# Patient Record
Sex: Female | Born: 1985
Health system: Southern US, Community
[De-identification: ages and names within clinical notes are randomized; demographics above are authoritative.]

## PROBLEM LIST (undated history)

## (undated) DIAGNOSIS — T7840XA Allergy, unspecified, initial encounter: Secondary | ICD-10-CM

## (undated) HISTORY — DX: Allergy, unspecified, initial encounter: T78.40XA

---
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
## 2005-07-02 ENCOUNTER — Emergency Department (HOSPITAL_COMMUNITY): Admission: EM | Admit: 2005-07-02 | Discharge: 2005-07-02 | Payer: Self-pay | Admitting: Emergency Medicine

## 2005-09-15 ENCOUNTER — Emergency Department: Payer: Self-pay | Admitting: Emergency Medicine

## 2011-07-13 ENCOUNTER — Ambulatory Visit (INDEPENDENT_AMBULATORY_CARE_PROVIDER_SITE_OTHER): Payer: Managed Care, Other (non HMO) | Admitting: Family Medicine

## 2011-07-13 VITALS — BP 125/76 | HR 77 | Temp 98.4°F | Resp 16 | Ht 66.0 in | Wt 199.0 lb

## 2011-07-13 DIAGNOSIS — N390 Urinary tract infection, site not specified: Secondary | ICD-10-CM

## 2011-07-13 DIAGNOSIS — IMO0001 Reserved for inherently not codable concepts without codable children: Secondary | ICD-10-CM

## 2011-07-13 DIAGNOSIS — Z309 Encounter for contraceptive management, unspecified: Secondary | ICD-10-CM

## 2011-07-13 DIAGNOSIS — H109 Unspecified conjunctivitis: Secondary | ICD-10-CM

## 2011-07-13 LAB — POCT URINALYSIS DIPSTICK
Bilirubin, UA: NEGATIVE
Glucose, UA: NEGATIVE
Ketones, UA: NEGATIVE
Nitrite, UA: NEGATIVE

## 2011-07-13 LAB — POCT UA - MICROSCOPIC ONLY
Bacteria, U Microscopic: NEGATIVE
Casts, Ur, LPF, POC: NEGATIVE

## 2011-07-13 LAB — POCT URINE PREGNANCY: Preg Test, Ur: NEGATIVE

## 2011-07-13 MED ORDER — ETONOGESTREL-ETHINYL ESTRADIOL 0.12-0.015 MG/24HR VA RING: VAGINAL_RING | VAGINAL | Status: DC

## 2011-07-13 MED ORDER — POLYMYXIN B-TRIMETHOPRIM 10000-0.1 UNIT/ML-% OP SOLN: 1.0000 [drp] | OPHTHALMIC | Status: AC

## 2011-07-13 MED ORDER — NITROFURANTOIN MONOHYD MACRO 100 MG PO CAPS: 100.0000 mg | ORAL_CAPSULE | Freq: Two times a day (BID) | ORAL | Status: AC

## 2011-07-13 NOTE — Progress Notes (Signed)
Patient Name: Victoria Sanchez Date of Birth: 10/05/1985 Medical Record Number: [id number] Gender: female Date of Encounter: 07/13/2011  History of Present Illness:  Victoria Sanchez is a 26 y.o. very pleasant female patient who presents with the following:  Here today with possible UTI- frequent urination and pain with urination.  No hematuria. This morning she noted that she had a little vaginal spotting (blood) but did not note blood in her urine.  No fever, chills or back pain, no vomiting.  Symptoms started over the last 48 hours.    She is otherwise generally healthy This seems like a UTI to her LMP was 06/28/11  No history of major health issues in the past No vaginal symptoms that she has noticed  No contraception currently- wishes to restart nuvaring.  She does have a current partner- they have been together for some time but their relationship has been on and off and recently restarted.  She already has an appt for a pap on 4/26, last had STD testing about 6 months ago.    No tobacco, no history of DVT or PE, no migraine history.   There is no problem list on file for this patient.  No past medical history on file. No past surgical history on file. History  Substance Use Topics  . Smoking status: Current Some Day Smoker  . Smokeless tobacco: Not on file  . Alcohol Use: Not on file   No family history on file. Allergies  Allergen Reactions  . Penicillins     Medication list has been reviewed and updated.  Review of Systems: As per HPI- otherwise negative.   Physical Examination: Filed Vitals:   07/13/11 1317  BP: 125/76  Pulse: 77  Temp: 98.4 F (36.9 C)  TempSrc: Oral  Resp: 16  Height: 5\' 6"  (1.676 m)  Weight: 199 lb (90.266 kg)    Body mass index is 32.12 kg/(m^2).  GEN: WDWN, NAD, Non-toxic, A & O x 3 HEENT: Atraumatic, Normocephalic. Neck supple. No masses, No LAD.  Slight injection of right conjunctiva, left eye normal,  PEERL, EOMI.  TM and  oropharynx wnl Ears and Nose: No external deformity. CV: RRR, No M/G/R. No JVD. No thrill. No extra heart sounds. PULM: CTA B, no wheezes, crackles, rhonchi. No retractions. No resp. distress. No accessory muscle use. ABD: S, NT, ND, +BS. No rebound. No HSM. EXTR: No c/c/e NEURO Normal gait.  PSYCH: Normally interactive. Conversant. Not depressed or anxious appearing.  Calm demeanor.   Results for orders placed in visit on 07/13/11  POCT URINALYSIS DIPSTICK      Component Value Range   Color, UA yellow     Clarity, UA cloudy     Glucose, UA neg     Bilirubin, UA neg     Ketones, UA neg     Spec Grav, UA 1.010     Blood, UA large     pH, UA 6.0     Protein, UA 30     Urobilinogen, UA 0.2     Nitrite, UA neg     Leukocytes, UA moderate (2+)    POCT UA - MICROSCOPIC ONLY      Component Value Range   WBC, Ur, HPF, POC 8-13     RBC, urine, microscopic 14-18     Bacteria, U Microscopic neg     Mucus, UA small     Epithelial cells, urine per micros 0-1     Crystals, Ur, HPF, POC neg  Casts, Ur, LPF, POC neg     Yeast, UA neg    POCT URINE PREGNANCY      Component Value Range   Preg Test, Ur Negative      Assessment and Plan: 1. UTI (urinary tract infection)  POCT Urinalysis Dipstick, POCT UA - Microscopic Only, Urine culture, POCT urine pregnancy, nitrofurantoin, macrocrystal-monohydrate, (MACROBID) 100 MG capsule  2. Contraception  etonogestrel-ethinyl estradiol (NUVARING) 0.12-0.015 MG/24HR vaginal ring  3. Conjunctivitis  trimethoprim-polymyxin b (POLYTRIM) ophthalmic solution   Patient wishes to start nuva- ring for birth control.  She denies any history of DVT/PE, migraine with aura, cancer, blood clotting disorder or tobacco use.  Instructed to start after her next menstrual period as her cycle is already halfway though.  Instructed as to importance of taking medication as directed, and encouraged to use condoms as well if at risk for STIs.   Will treat for presumed  UTI with macrobid, but let me know if not better in a couple of days.  Sooner if worse.   Also treat for mild conjunctivitis with polytrim   Will follow-up further with ucx result

## 2011-07-16 LAB — URINE CULTURE: Colony Count: 30000

## 2014-01-08 ENCOUNTER — Telehealth: Payer: Self-pay

## 2014-01-08 NOTE — Telephone Encounter (Signed)
Pt wants a call back about a Nuva Ring questions.  [phone]

## 2014-01-09 NOTE — Telephone Encounter (Signed)
Lm for rtn call 

## 2014-01-17 NOTE — Telephone Encounter (Signed)
Pt went to [facility] to be seen and they answered her questions.

## 2014-10-19 ENCOUNTER — Emergency Department (HOSPITAL_BASED_OUTPATIENT_CLINIC_OR_DEPARTMENT_OTHER)
Admission: EM | Admit: 2014-10-19 | Discharge: 2014-10-19 | Disposition: A | Discharge status: Home / Self Care | Payer: BLUE CROSS/BLUE SHIELD | Attending: Emergency Medicine | Admitting: Emergency Medicine

## 2014-10-19 ENCOUNTER — Encounter (HOSPITAL_BASED_OUTPATIENT_CLINIC_OR_DEPARTMENT_OTHER): Payer: Self-pay | Admitting: *Deleted

## 2014-10-19 ENCOUNTER — Emergency Department (HOSPITAL_BASED_OUTPATIENT_CLINIC_OR_DEPARTMENT_OTHER): Payer: BLUE CROSS/BLUE SHIELD

## 2014-10-19 DIAGNOSIS — Z720 Tobacco use: Secondary | ICD-10-CM | POA: Insufficient documentation

## 2014-10-19 DIAGNOSIS — B349 Viral infection, unspecified: Secondary | ICD-10-CM | POA: Insufficient documentation

## 2014-10-19 DIAGNOSIS — Z880 Allergy status to penicillin: Secondary | ICD-10-CM | POA: Diagnosis not present

## 2014-10-19 DIAGNOSIS — R05 Cough: Secondary | ICD-10-CM | POA: Diagnosis present

## 2014-10-19 LAB — RAPID STREP SCREEN (MED CTR MEBANE ONLY): Streptococcus, Group A Screen (Direct): NEGATIVE

## 2014-10-19 NOTE — ED Notes (Signed)
Pt denies any voice/ speech changes

## 2014-10-19 NOTE — ED Notes (Signed)
MD at bedside. 

## 2014-10-19 NOTE — ED Notes (Signed)
Patient transported to X-ray 

## 2014-10-19 NOTE — ED Notes (Signed)
Presents with "lump" type feeling in throat, has some difficulty swallowing food, denies any vomiting

## 2014-10-19 NOTE — Discharge Instructions (Signed)
Make sure that you drink at least six 8 ounce glasses of water or Gatorade daily. Call the [facility] and wellness Center to get a primary care physician. Make an appointment to be seen by your new doctor if not continuing to improve in a week. Ask your new doctor to help you to stop smoking.

## 2014-10-19 NOTE — ED Provider Notes (Signed)
CSN: [id number]     Arrival date & time 10/19/14  0818 History   First MD Initiated Contact with Patient 10/19/14 [phone]     Chief Complaint  Patient presents with  . Mass     (Consider location/radiation/quality/duration/timing/severity/associated sxs/prior Treatment) HPIpatient complains of a vague feeling of fullness in her right throat which started 8 days ago which is steadily improving without treatment. Discomfort is worse with swallowing.not improved by anything.  Other associated symptoms include mild cough, chills, denies shortness of breath.all symptoms are improving with time. She no other associated symptoms  No past medical history on file.past medical history negative No past surgical history on file. No family history on file. History  Substance Use Topics  . Smoking status: Current Some Day Smoker  . Smokeless tobacco: Not on file  . Alcohol Use: Not on file  moderate alcohol denies illicit drug use OB History    No data available     Review of Systems  Constitutional: Negative.   HENT: Positive for congestion and sore throat.   Respiratory: Positive for cough.   Cardiovascular: Negative.   Gastrointestinal: Negative.   Musculoskeletal: Negative.   Skin: Negative.   Neurological: Negative.   Psychiatric/Behavioral: Negative.   All other systems reviewed and are negative.     Allergies  Penicillins  Home Medications   Prior to Admission medications   Medication Sig Start Date End Date Taking? Authorizing Provider  etonogestrel-ethinyl estradiol (NUVARING) 0.12-0.015 MG/24HR vaginal ring Insert vaginally and leave in place for 3 consecutive weeks, then remove for 1 week. 07/13/11 07/12/12  Gwenlyn Found Copland, MD   BP 118/80 mmHg  Pulse 86  Temp(Src) 98.3 F (36.8 C) (Oral)  Resp 18  Ht 5\' 5"  (1.651 m)  Wt 185 lb (83.915 kg)  BMI 30.79 kg/m2  SpO2 100% Physical Exam  Constitutional: She appears well-developed and well-nourished. No distress.  HENT:   Head: Normocephalic and atraumatic.  Right Ear: External ear normal.  Left Ear: External ear normal.  Nose: Nose normal.  Mouth/Throat: Oropharyngeal exudate present.  Bilateral tympanic membranes normal. Oral pharynx mildly reddened. No exudate. Uvula midline. Tonsils appear normal  Eyes: Conjunctivae are normal. Pupils are equal, round, and reactive to light.  Neck: Neck supple. No tracheal deviation present. No thyromegaly present.  No masses  Cardiovascular: Normal rate and regular rhythm.   No murmur heard. Pulmonary/Chest: Effort normal and breath sounds normal.  Abdominal: Soft. Bowel sounds are normal. She exhibits no distension. There is no tenderness.  Musculoskeletal: Normal range of motion. She exhibits no edema or tenderness.  Neurological: She is alert. Coordination normal.  Skin: Skin is warm and dry. No rash noted.  Psychiatric: She has a normal mood and affect.  Nursing note and vitals reviewed.   ED Course  Procedures (including critical care time) Labs Review Labs Reviewed - No data to display  Imaging Review No results found.   EKG Interpretation None      X-ray viewed by me Counseled patient for 5 minutes on smoking cessation. 950 a.m. Patient resting comfortably. No distress. Results for orders placed or performed during the hospital encounter of 10/19/14  Rapid strep screen  Result Value Ref Range   Streptococcus, Group A Screen (Direct) NEGATIVE NEGATIVE   Dg Neck Soft Tissue  10/19/2014   CLINICAL DATA:  Difficulty swallowing, sensation of something stuck in throat.  EXAM: NECK SOFT TISSUES - 1+ VIEW  COMPARISON:  None.  FINDINGS: There is no evidence of retropharyngeal soft  tissue swelling or epiglottic enlargement. The cervical airway is unremarkable and no radio-opaque foreign body identified.  IMPRESSION: No acute abnormality noted.   Electronically Signed   By: Alcide Clever M.D.   On: 10/19/2014 09:11    MDM  Strongly suspect viral illness.  Patient well-appearing, chills, mild oropharyngeal erythema.Symptoms steadily improving with time. Encourage oral hydration. Referral [facility] wellness [facility] Plan home observation. Diagnosis #1. Viral illness #2 tobacco abuse Final diagnoses:  None        Doug Sou, MD 10/19/14 [phone]

## 2014-10-19 NOTE — ED Notes (Signed)
Pt denies sore throat, denies any SOB or vomiting, tolerating food well

## 2014-10-21 LAB — CULTURE, GROUP A STREP: Strep A Culture: NEGATIVE

## 2014-12-06 ENCOUNTER — Ambulatory Visit (INDEPENDENT_AMBULATORY_CARE_PROVIDER_SITE_OTHER): Payer: BLUE CROSS/BLUE SHIELD | Admitting: Family Medicine

## 2014-12-06 ENCOUNTER — Other Ambulatory Visit: Payer: Self-pay | Admitting: Family Medicine

## 2014-12-06 VITALS — BP 122/74 | HR 66 | Temp 98.1°F | Resp 18 | Ht 66.0 in | Wt 178.0 lb

## 2014-12-06 DIAGNOSIS — J029 Acute pharyngitis, unspecified: Secondary | ICD-10-CM

## 2014-12-06 DIAGNOSIS — R634 Abnormal weight loss: Secondary | ICD-10-CM | POA: Diagnosis not present

## 2014-12-06 NOTE — Progress Notes (Signed)
[facility] [address] [place] [address] [phone]  Date:  12/06/2014   Name:  Victoria Sanchez   DOB:  10/16/1985   MRN:  [id number]  PCP:  No PCP Per Patient    Chief Complaint: Sore Throat; Headache; and Fatigue   History of Present Illness:  Victoria Sanchez is a 29 y.o. very pleasant female patient who presents with the following:  Generally healthy young woman here today with complaint of sore throat. She has been ill for about one month, was seen at the ER on 7/23 and had an "x-ray of my throat which looked ok", and had a negative strep test.    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   She was only eating soup for the first couple of weeks- think this may be why she has lost some weight.   She has not thought that she had heartburn- she did try prilosec for one day but did not notice a difference   She did have some diarrhea at first, but this is now better She has not noted a fever, but did have some chills at first- this is now resolved  LMP 3 weeks ago  Wt Readings from Last 3 Encounters:  12/06/14 178 lb (80.74 kg)  10/19/14 185 lb (83.915 kg)  07/13/11 199 lb (90.266 kg)    There are no active problems to display for this patient.   History reviewed. No pertinent past medical history.  History reviewed. No pertinent past surgical history.  Social History  Substance Use Topics  . Smoking status: Never Smoker   . Smokeless tobacco: None  . Alcohol Use: 0.0 oz/week    0 Standard drinks or  equivalent per week     Comment: social    History reviewed. No pertinent family history.  Allergies  Allergen Reactions  . Penicillins     Medication list has been reviewed and updated.  Current Outpatient Prescriptions on File Prior to Visit  Medication Sig Dispense Refill  . etonogestrel-ethinyl estradiol (NUVARING) 0.12-0.015 MG/24HR vaginal ring Insert vaginally and leave in place for 3 consecutive weeks, then remove for 1 week. 3 each 3   No current facility-administered medications on file prior to visit.    Review of Systems:  As per HPI- otherwise negative.   Physical Examination: Filed Vitals:   12/06/14 1759  BP: 122/74  Temp: 98.1 F (36.7 C)  Resp: 18   Filed Vitals:   12/06/14 1759  Height: 5\' 6"  (1.676 m)  Weight: 178 lb (80.74 kg)   Body mass index is 28.74 kg/(m^2). Ideal Body Weight: Weight in (lb) to have BMI = 25: 154.6  GEN: WDWN, NAD, Non-toxic, A & O x 3, overweight, looks well HEENT: Atraumatic, Normocephalic. Neck supple. No masses, No LAD.  Bilateral TM wnl, oropharynx normal.  PEERL,EOMI.   Ears and Nose: No external deformity. CV: RRR, No M/G/R. No JVD. No thrill. No  extra heart sounds. PULM: CTA B, no wheezes, crackles, rhonchi. No retractions. No resp. distress. No accessory muscle use. ABD: S, NT, ND. No rebound. No HSM.  Benign belly EXTR: No c/c/e NEURO Normal gait.  PSYCH: Normally interactive. Conversant. Not depressed or anxious appearing.  Calm demeanor.   Assessment and Plan: Acute pharyngitis, unspecified pharyngitis type - Plan: Comprehensive metabolic panel, Epstein-Barr virus VCA antibody panel, CBC  Loss of weight - Plan: TSH  Here today with ST and weight loss- will be in touch with her regarding her labs.  Suspect it may be mono. Her mother is extremely concerned and "we need an answer right now."  Reassured that we will go through her evaluation in a step- wise fashion until we come to a solution, and they felt  comfortable with the plan  Signed Abbe Amsterdam, MD

## 2014-12-06 NOTE — Patient Instructions (Signed)
I will be in touch with your labs asap- for the time being I would only use tylenol as needed for your symptoms.   If your mono test is negative we will consider other treatment options

## 2014-12-07 LAB — CBC
HEMATOCRIT: 41 % (ref 36.0–46.0)
Hemoglobin: 13.9 g/dL (ref 12.0–15.0)
MCH: 30.4 pg (ref 26.0–34.0)
MCHC: 33.9 g/dL (ref 30.0–36.0)
MCV: 89.7 fL (ref 78.0–100.0)
MPV: 9.4 fL (ref 8.6–12.4)
PLATELETS: 352 10*3/uL (ref 150–400)
RBC: 4.57 MIL/uL (ref 3.87–5.11)
RDW: 14.1 % (ref 11.5–15.5)
WBC: 5.3 10*3/uL (ref 4.0–10.5)

## 2014-12-07 LAB — COMPREHENSIVE METABOLIC PANEL
ALT: 18 U/L (ref 6–29)
AST: 20 U/L (ref 10–30)
Albumin: 4 g/dL (ref 3.6–5.1)
Alkaline Phosphatase: 73 U/L (ref 33–115)
BILIRUBIN TOTAL: 1.3 mg/dL — AB (ref 0.2–1.2)
BUN: 5 mg/dL — AB (ref 7–25)
CO2: 23 mmol/L (ref 20–31)
CREATININE: 0.65 mg/dL (ref 0.50–1.10)
Calcium: 9.5 mg/dL (ref 8.6–10.2)
Chloride: 105 mmol/L (ref 98–110)
GLUCOSE: 84 mg/dL (ref 65–99)
Potassium: 4.2 mmol/L (ref 3.5–5.3)
SODIUM: 137 mmol/L (ref 135–146)
Total Protein: 7.2 g/dL (ref 6.1–8.1)

## 2014-12-07 LAB — TSH: TSH: 0.906 u[IU]/mL (ref 0.350–4.500)

## 2014-12-09 ENCOUNTER — Encounter: Payer: Self-pay | Admitting: Family Medicine

## 2014-12-09 LAB — BILIRUBIN, FRACTIONATED(TOT/DIR/INDIR)
Bilirubin, Direct: 0.3 mg/dL — ABNORMAL HIGH (ref <=0.2)
Indirect Bilirubin: 1 mg/dL (ref 0.2–1.2)
Total Bilirubin: 1.3 mg/dL — ABNORMAL HIGH (ref 0.2–1.2)

## 2014-12-09 LAB — EPSTEIN-BARR VIRUS VCA ANTIBODY PANEL
EBV NA IgG: 117 U/mL — ABNORMAL HIGH (ref <18.0)
EBV VCA IgG: 135 U/mL — ABNORMAL HIGH (ref <18.0)
EBV VCA IgM: <10 U/mL (ref <36.0)

## 2015-08-14 ENCOUNTER — Telehealth: Payer: Self-pay | Admitting: Family Medicine

## 2015-08-14 ENCOUNTER — Ambulatory Visit: Payer: BLUE CROSS/BLUE SHIELD | Admitting: Family Medicine

## 2015-08-14 ENCOUNTER — Ambulatory Visit (INDEPENDENT_AMBULATORY_CARE_PROVIDER_SITE_OTHER): Payer: BLUE CROSS/BLUE SHIELD | Admitting: Family Medicine

## 2015-08-14 ENCOUNTER — Encounter: Payer: Self-pay | Admitting: Family Medicine

## 2015-08-14 VITALS — BP 116/75 | HR 92 | Temp 98.6°F | Ht 65.0 in | Wt 192.2 lb

## 2015-08-14 DIAGNOSIS — J309 Allergic rhinitis, unspecified: Secondary | ICD-10-CM

## 2015-08-14 DIAGNOSIS — R17 Unspecified jaundice: Secondary | ICD-10-CM | POA: Diagnosis not present

## 2015-08-14 DIAGNOSIS — F410 Panic disorder [episodic paroxysmal anxiety] without agoraphobia: Secondary | ICD-10-CM

## 2015-08-14 MED ORDER — FLUTICASONE PROPIONATE 50 MCG/ACT NA SUSP: 2.0000 | Freq: Every day | NASAL | Status: DC

## 2015-08-14 MED ORDER — ALPRAZOLAM 0.25 MG PO TABS: 0.2500 mg | ORAL_TABLET | Freq: Two times a day (BID) | ORAL | Status: DC | PRN

## 2015-08-14 NOTE — Progress Notes (Signed)
[facility] [address] [place], [place] [id number] [phone]  Date:  08/14/2015   Name:  Victoria GanserBrianna Righter   DOB:  01/25/1986   MRN:  [id number]  PCP:  No PCP Per Patient    Chief Complaint: Anxiety and Allergies   History of Present Illness:  Victoria Sanchez is a 30 y.o. very pleasant female patient who presents with the following:  Here today to discuss stress.  Yesterday while at work she "felt like I was going to have a panic attack."  She looked up her sx online and thinks that she is indeed having some panic sx She has been under stress at work with mandatory overtime and management change. She is suspect to quotas and is working a lot of long hours.  She will feel like she can't breathe if she gets too overwhelmed. She will try to drink water and take deep breaths which often does help.    She has noted these sx over the last few months- some days she will feel "like I'm about to throw my computer across the room."  She has had a panic attack maybe twice- it lasted about 5 minutes.   Her home life is ok- she has a BF and their relationship is good Physically she is feeling well  She does not feel that she is depressed- she just feels like she dreads her job as of late.  She will feel anxious and jittery mostly just at work, occasionally at home. This is interspersed with the panic attacks She has not had panic sx in the past.   Has not suffered from anxiety or depression in the past  She is sleeping well Appetite is ok.   She has not had a lot of downtime but she tries to enjoy herself when she can.  She enjoys reading, being outside, movies, traveling.  She does not feel anxious or upset except for when she is going to or at work   She tends to have some allergic rhinitis- she is not sure what she should try for this  Also noted mildly elevated bilirubin at our last visit in September- will recheck this  today  Wt Readings from Last 3 Encounters:  08/14/15 192 lb 3.2 oz (87.181 kg)  12/06/14 178 lb (80.74 kg)  10/19/14 185 lb (83.915 kg)     There are no active problems to display for this patient.   Past Medical History  Diagnosis Date  . Allergy     Seasonal    No past surgical history on file.  Social History  Substance Use Topics  . Smoking status: Never Smoker   . Smokeless tobacco: None  . Alcohol Use: 0.0 oz/week    0 Standard drinks or equivalent per week     Comment: social    Family History  Problem Relation Age of Onset  . Hypertension Mother     Allergies  Allergen Reactions  . Penicillins     Medication list has been reviewed and updated.  No current outpatient prescriptions on file prior to visit.   No current facility-administered medications on file prior to visit.    Review of Systems:  As per HPI- otherwise negative. LMP 08/01/15   Physical Examination: Filed Vitals:   08/14/15 1454  BP: 116/75  Pulse: 92  Temp: 98.6 F (37 C)    Ideal Body Weight:    GEN: WDWN, NAD, Non-toxic, A &  O x 3, overweight, look well HEENT: Atraumatic, Normocephalic. Neck supple. No masses, No LAD. Ears and Nose: No external deformity. CV: RRR, No M/G/R. No JVD. No thrill. No extra heart sounds. PULM: CTA B, no wheezes, crackles, rhonchi. No retractions. No resp. distress. No accessory muscle use. EXTR: No c/c/e NEURO Normal gait.  PSYCH: Normally interactive. Conversant. Not depressed or anxious appearing.  Calm demeanor.     Assessment and Plan: Panic attacks - Plan: ALPRAZolam (XANAX) 0.25 MG tablet  Allergic rhinitis, unspecified allergic rhinitis type - Plan: fluticasone (FLONASE) 50 MCG/ACT nasal spray  Elevated bilirubin - Plan: Hepatic function panel here today with work related anxiety and panic attacks.  Consider starting an SSRI, but wonder if occasional xanax for panic may be all that she needs as her sx are only present at work.   Given rx and discussed safe use- if she is using this a lot will add an SSRI  It was good to see you today. We will check on your liver function today  For your allergies, the easiest thing may be to take an OTC claritin or zyrtec daily (generic is fine!)  If this does not help try the nasal spray also  It sounds like you are having panic attacks and work related anxiety  Use a xanax when needed for panic- remember that this medication can make you a bit sleepy and should not be combined with alcohol. If you find you need this more than 2x a week let me know and we will want to add a medication to help prevent the anxiety from happening    Signed Abbe Amsterdam, MD

## 2015-08-14 NOTE — Progress Notes (Signed)
Pre visit review using our clinic review tool, if applicable. No additional management support is needed unless otherwise documented below in the visit note. 

## 2015-08-14 NOTE — Patient Instructions (Signed)
It was good to see you today. We will check on your liver function today For your allergies, the easiest thing may be to take an OTC claritin or zyrtec daily (generic is fine!) If this does not help try the nasal spray also It sounds like you are having panic attacks and work related anxiety Use a xanax when needed for panic- remember that this medication can make you a bit sleepy and should not be combined with alcohol. If you find you need this more than 2x a week let me know and we will want to add a medication to help prevent the anxiety from happening

## 2015-08-15 LAB — HEPATIC FUNCTION PANEL
ALK PHOS: 56 U/L (ref 39–117)
ALT: 12 U/L (ref 0–35)
AST: 18 U/L (ref 0–37)
Albumin: 4.3 g/dL (ref 3.5–5.2)
Bilirubin, Direct: 0.1 mg/dL (ref 0.0–0.3)
TOTAL PROTEIN: 6.8 g/dL (ref 6.0–8.3)
Total Bilirubin: 0.7 mg/dL (ref 0.2–1.2)

## 2015-08-19 NOTE — Telephone Encounter (Signed)
Pt was no show morning of 5/18 but came in later that same day, charge or no charge?

## 2015-08-20 NOTE — Telephone Encounter (Signed)
No charge. 

## 2015-09-15 ENCOUNTER — Ambulatory Visit (INDEPENDENT_AMBULATORY_CARE_PROVIDER_SITE_OTHER): Payer: BLUE CROSS/BLUE SHIELD | Admitting: Family Medicine

## 2015-09-15 ENCOUNTER — Encounter: Payer: Self-pay | Admitting: Family Medicine

## 2015-09-15 VITALS — BP 110/70 | HR 89 | Temp 98.3°F | Ht 65.0 in | Wt 185.6 lb

## 2015-09-15 DIAGNOSIS — T1490XA Injury, unspecified, initial encounter: Secondary | ICD-10-CM

## 2015-09-15 DIAGNOSIS — T1490 Injury, unspecified: Secondary | ICD-10-CM

## 2015-09-15 NOTE — Progress Notes (Signed)
Pre visit review using our clinic review tool, if applicable. No additional management support is needed unless otherwise documented below in the visit note. 
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
## 2015-09-15 NOTE — Patient Instructions (Signed)
Please fill your xanax rx and use it as needed over the next several days.  Talking with supportive friends and family can be very helpful You may want to seek professional counseling- [place] many good counselors [place]  We do have counseling services associated with [facility]- see hand out sheet You can also look at the psychology today website for a good list of services  Please let me know how you are doing over the next 1-2 weeks!

## 2015-09-22 ENCOUNTER — Ambulatory Visit (INDEPENDENT_AMBULATORY_CARE_PROVIDER_SITE_OTHER): Payer: BLUE CROSS/BLUE SHIELD | Admitting: Licensed Clinical Social Worker

## 2015-09-22 DIAGNOSIS — F4323 Adjustment disorder with mixed anxiety and depressed mood: Secondary | ICD-10-CM

## 2015-10-01 ENCOUNTER — Ambulatory Visit: Payer: BLUE CROSS/BLUE SHIELD | Admitting: Licensed Clinical Social Worker

## 2015-10-15 ENCOUNTER — Ambulatory Visit (INDEPENDENT_AMBULATORY_CARE_PROVIDER_SITE_OTHER): Payer: BLUE CROSS/BLUE SHIELD | Admitting: Family Medicine

## 2015-10-15 ENCOUNTER — Encounter: Payer: Self-pay | Admitting: Family Medicine

## 2015-10-15 VITALS — BP 134/87 | HR 77 | Temp 98.6°F | Ht 65.0 in | Wt 182.2 lb

## 2015-10-15 DIAGNOSIS — F410 Panic disorder [episodic paroxysmal anxiety] without agoraphobia: Secondary | ICD-10-CM

## 2015-10-15 DIAGNOSIS — F4310 Post-traumatic stress disorder, unspecified: Secondary | ICD-10-CM

## 2015-10-15 MED ORDER — FLUOXETINE HCL 20 MG PO TABS: 20.0000 mg | ORAL_TABLET | Freq: Every day | ORAL | Status: DC

## 2015-10-15 MED ORDER — ALPRAZOLAM 0.25 MG PO TABS: 0.2500 mg | ORAL_TABLET | Freq: Two times a day (BID) | ORAL | Status: DC | PRN
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
## 2015-10-29 ENCOUNTER — Emergency Department (HOSPITAL_COMMUNITY): Payer: BLUE CROSS/BLUE SHIELD

## 2015-10-29 ENCOUNTER — Emergency Department (HOSPITAL_COMMUNITY)
Admission: EM | Admit: 2015-10-29 | Discharge: 2015-10-29 | Disposition: A | Discharge status: Home / Self Care | Payer: BLUE CROSS/BLUE SHIELD | Attending: Emergency Medicine | Admitting: Emergency Medicine

## 2015-10-29 ENCOUNTER — Encounter (HOSPITAL_COMMUNITY): Payer: Self-pay | Admitting: Emergency Medicine

## 2015-10-29 DIAGNOSIS — Y999 Unspecified external cause status: Secondary | ICD-10-CM | POA: Diagnosis not present

## 2015-10-29 DIAGNOSIS — M542 Cervicalgia: Secondary | ICD-10-CM | POA: Insufficient documentation

## 2015-10-29 DIAGNOSIS — Y9389 Activity, other specified: Secondary | ICD-10-CM | POA: Diagnosis not present

## 2015-10-29 DIAGNOSIS — Z7951 Long term (current) use of inhaled steroids: Secondary | ICD-10-CM | POA: Insufficient documentation

## 2015-10-29 DIAGNOSIS — M79671 Pain in right foot: Secondary | ICD-10-CM

## 2015-10-29 DIAGNOSIS — S90811A Abrasion, right foot, initial encounter: Secondary | ICD-10-CM | POA: Diagnosis not present

## 2015-10-29 DIAGNOSIS — S99921A Unspecified injury of right foot, initial encounter: Secondary | ICD-10-CM | POA: Diagnosis present

## 2015-10-29 DIAGNOSIS — Y92410 Unspecified street and highway as the place of occurrence of the external cause: Secondary | ICD-10-CM | POA: Diagnosis not present

## 2015-10-29 DIAGNOSIS — R51 Headache: Secondary | ICD-10-CM | POA: Insufficient documentation

## 2015-10-29 MED ORDER — NAPROXEN 500 MG PO TABS
500.0000 mg | ORAL_TABLET | Freq: Once | ORAL | Status: AC
  Administered 2015-10-29: 500 mg via ORAL
  Filled 2015-10-29: qty 1

## 2015-10-29 MED ORDER — ACETAMINOPHEN 325 MG PO TABS
650.0000 mg | ORAL_TABLET | Freq: Once | ORAL | Status: AC
  Administered 2015-10-29: 650 mg via ORAL
  Filled 2015-10-29: qty 2

## 2015-10-29 MED ORDER — NAPROXEN 500 MG PO TABS: 500.0000 mg | ORAL_TABLET | Freq: Two times a day (BID) | ORAL | 0 refills | Status: DC

## 2015-10-29 MED ORDER — METHOCARBAMOL 500 MG PO TABS: 500.0000 mg | ORAL_TABLET | Freq: Two times a day (BID) | ORAL | 0 refills | Status: DC | PRN

## 2015-10-29 NOTE — ED Notes (Signed)
Patient transported to X-ray 
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
## 2015-11-10 ENCOUNTER — Telehealth: Payer: Self-pay

## 2015-11-10 NOTE — Telephone Encounter (Signed)
Received FMLA paperwork via fax on 11/10/15.  Called patient to verify reason for intermittent leave.  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  The same was indicated on patient's top sheet and forms were forwarded to Dr. Patsy Lageropland for completion.

## 2015-11-13 ENCOUNTER — Encounter: Payer: Self-pay | Admitting: Family Medicine
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
## 2015-11-17 ENCOUNTER — Ambulatory Visit (INDEPENDENT_AMBULATORY_CARE_PROVIDER_SITE_OTHER): Payer: BLUE CROSS/BLUE SHIELD | Admitting: Family Medicine

## 2015-11-17 ENCOUNTER — Ambulatory Visit (HOSPITAL_BASED_OUTPATIENT_CLINIC_OR_DEPARTMENT_OTHER)
Admission: RE | Admit: 2015-11-17 | Discharge: 2015-11-17 | Disposition: A | Discharge status: Home / Self Care | Payer: BLUE CROSS/BLUE SHIELD | Source: Ambulatory Visit | Attending: Family Medicine | Admitting: Family Medicine

## 2015-11-17 ENCOUNTER — Encounter: Payer: Self-pay | Admitting: Family Medicine

## 2015-11-17 DIAGNOSIS — S46819A Strain of other muscles, fascia and tendons at shoulder and upper arm level, unspecified arm, initial encounter: Secondary | ICD-10-CM | POA: Diagnosis not present

## 2015-11-17 DIAGNOSIS — F4310 Post-traumatic stress disorder, unspecified: Secondary | ICD-10-CM | POA: Diagnosis not present

## 2015-11-17 DIAGNOSIS — R0781 Pleurodynia: Secondary | ICD-10-CM

## 2015-11-17 NOTE — Patient Instructions (Signed)
It was very nice to see you today- please go to the ground floor to have your ribs x-rayed, then you may go home. I will be in touch with your x-rays. I think that your symptoms are a combination of muscle strain and pain, possible mild concussion and of course stress from recent life events!  We will decrease your work hours and go back to your normal schedule gradually. If you are not doing ok with the reduced hours let me know.   Heat, massage, gentle activity and the robaxin muscle relaxer will also be helpful.  Remember that the robaxin can make you drowsy; avoid combining this with xanax.
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
## 2015-11-17 NOTE — Progress Notes (Signed)
Pre visit review using our clinic review tool, if applicable. No additional management support is needed unless otherwise documented below in the visit note. 

## 2015-11-19 ENCOUNTER — Ambulatory Visit (INDEPENDENT_AMBULATORY_CARE_PROVIDER_SITE_OTHER): Payer: BLUE CROSS/BLUE SHIELD | Admitting: Family Medicine

## 2015-11-19 DIAGNOSIS — Z0189 Encounter for other specified special examinations: Secondary | ICD-10-CM | POA: Diagnosis not present

## 2015-11-19 NOTE — Progress Notes (Signed)
Pre visit review using our clinic review tool, if applicable. No additional management support is needed unless otherwise documented below in the visit note. 
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
## 2015-12-02 ENCOUNTER — Encounter: Payer: Self-pay | Admitting: Family Medicine

## 2015-12-04 ENCOUNTER — Telehealth: Payer: Self-pay | Admitting: Family Medicine

## 2015-12-04 ENCOUNTER — Encounter: Payer: Self-pay | Admitting: Family Medicine

## 2015-12-04 NOTE — Telephone Encounter (Signed)
Called her FMLA carrier- since we did STD for her this is not needed.  Will send a message to Colin MuldersBrianna to make sure her HR person is covering this
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
## 2016-03-30 DIAGNOSIS — R319 Hematuria, unspecified: Secondary | ICD-10-CM | POA: Diagnosis not present

## 2016-03-30 DIAGNOSIS — N390 Urinary tract infection, site not specified: Secondary | ICD-10-CM | POA: Diagnosis not present

## 2016-05-18 ENCOUNTER — Telehealth: Payer: Self-pay | Admitting: Medical

## 2016-05-18 ENCOUNTER — Ambulatory Visit (INDEPENDENT_AMBULATORY_CARE_PROVIDER_SITE_OTHER): Payer: BLUE CROSS/BLUE SHIELD | Admitting: Medical

## 2016-05-18 DIAGNOSIS — R300 Dysuria: Secondary | ICD-10-CM

## 2016-05-18 LAB — POC URINALSYSI DIPSTICK (AUTOMATED)
BILIRUBIN UA: NEGATIVE
GLUCOSE UA: NEGATIVE
Ketones, UA: NEGATIVE
Leukocytes, UA: NEGATIVE
Nitrite, UA: NEGATIVE
Protein, UA: NEGATIVE
SPEC GRAV UA: 1.02
UROBILINOGEN UA: NEGATIVE
pH, UA: 6

## 2016-05-18 NOTE — Telephone Encounter (Signed)
Pt got here late. She got here late. So I am putting in ua and culture. We will do study. But if study negaitve will be advised needs to follow up. She will also have a appointment on Thursday.  At time she arrived I had no nurse. Pt arrive 45 minutes late.

## 2016-05-19 LAB — URINE CULTURE: Organism ID, Bacteria: NO GROWTH

## 2016-05-19 NOTE — Progress Notes (Signed)
Showed up late. Did not see but ordered urine and culture based on her complaint. Rescheduled for thursday

## 2016-05-20 ENCOUNTER — Ambulatory Visit: Payer: Self-pay | Admitting: Medical

## 2016-05-25 ENCOUNTER — Ambulatory Visit: Payer: Self-pay | Admitting: Medical

## 2016-05-26 DIAGNOSIS — J029 Acute pharyngitis, unspecified: Secondary | ICD-10-CM | POA: Diagnosis not present

## 2016-09-30 ENCOUNTER — Emergency Department (HOSPITAL_COMMUNITY)
Admission: EM | Admit: 2016-09-30 | Discharge: 2016-09-30 | Disposition: A | Discharge status: Home / Self Care | Payer: No Typology Code available for payment source | Attending: Emergency Medicine | Admitting: Emergency Medicine

## 2016-09-30 ENCOUNTER — Encounter (HOSPITAL_COMMUNITY): Payer: Self-pay | Admitting: Emergency Medicine

## 2016-09-30 ENCOUNTER — Emergency Department (HOSPITAL_COMMUNITY): Payer: No Typology Code available for payment source

## 2016-09-30 DIAGNOSIS — M791 Myalgia: Secondary | ICD-10-CM | POA: Insufficient documentation

## 2016-09-30 DIAGNOSIS — M25512 Pain in left shoulder: Secondary | ICD-10-CM | POA: Diagnosis not present

## 2016-09-30 DIAGNOSIS — M549 Dorsalgia, unspecified: Secondary | ICD-10-CM | POA: Insufficient documentation

## 2016-09-30 DIAGNOSIS — R51 Headache: Secondary | ICD-10-CM | POA: Diagnosis not present

## 2016-09-30 DIAGNOSIS — Z79899 Other long term (current) drug therapy: Secondary | ICD-10-CM | POA: Insufficient documentation

## 2016-09-30 LAB — PREGNANCY, URINE: PREG TEST UR: NEGATIVE

## 2016-09-30 MED ORDER — IBUPROFEN 200 MG PO TABS
600.0000 mg | ORAL_TABLET | Freq: Once | ORAL | Status: AC
  Administered 2016-09-30: 600 mg via ORAL
  Filled 2016-09-30: qty 3

## 2016-09-30 NOTE — Discharge Instructions (Signed)
Please read attached information regarding your condition. Continue ibuprofen for pain and inflammation. Follow-up with PCP for further evaluation. Return to ED for worsening pain, additional injury, numbness, weakness, inability to walk or loss of bladder function.
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
## 2016-10-04 ENCOUNTER — Ambulatory Visit (INDEPENDENT_AMBULATORY_CARE_PROVIDER_SITE_OTHER): Payer: 59 | Admitting: Family Medicine

## 2016-10-04 DIAGNOSIS — M791 Myalgia, unspecified site: Secondary | ICD-10-CM

## 2016-10-04 MED ORDER — METHOCARBAMOL 500 MG PO TABS: 500.0000 mg | ORAL_TABLET | Freq: Three times a day (TID) | ORAL | 0 refills | Status: DC
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
## 2016-10-22 DIAGNOSIS — M545 Low back pain: Secondary | ICD-10-CM | POA: Diagnosis not present

## 2016-10-22 DIAGNOSIS — M542 Cervicalgia: Secondary | ICD-10-CM | POA: Diagnosis not present

## 2016-10-22 DIAGNOSIS — M7552 Bursitis of left shoulder: Secondary | ICD-10-CM | POA: Diagnosis not present

## 2016-11-05 DIAGNOSIS — M545 Low back pain: Secondary | ICD-10-CM | POA: Diagnosis not present

## 2016-11-05 DIAGNOSIS — M542 Cervicalgia: Secondary | ICD-10-CM | POA: Diagnosis not present

## 2016-11-05 DIAGNOSIS — M7552 Bursitis of left shoulder: Secondary | ICD-10-CM | POA: Diagnosis not present

## 2017-03-03 ENCOUNTER — Encounter: Payer: Self-pay | Admitting: Family Medicine

## 2017-03-03 ENCOUNTER — Ambulatory Visit: Payer: 59 | Admitting: Family Medicine

## 2017-03-03 VITALS — BP 104/70 | HR 84 | Temp 98.0°F | Ht 65.0 in | Wt 180.6 lb

## 2017-03-03 DIAGNOSIS — L309 Dermatitis, unspecified: Secondary | ICD-10-CM | POA: Diagnosis not present

## 2017-03-03 DIAGNOSIS — R6883 Chills (without fever): Secondary | ICD-10-CM | POA: Diagnosis not present

## 2017-03-03 DIAGNOSIS — R5381 Other malaise: Secondary | ICD-10-CM

## 2017-03-03 LAB — POCT INFLUENZA A/B
INFLUENZA A, POC: NEGATIVE
Influenza B, POC: NEGATIVE

## 2017-03-03 MED ORDER — TRIAMCINOLONE ACETONIDE 0.1 % EX CREA: 1.0000 "application " | TOPICAL_CREAM | Freq: Two times a day (BID) | CUTANEOUS | 1 refills | Status: DC

## 2017-03-03 MED ORDER — OSELTAMIVIR PHOSPHATE 75 MG PO CAPS: 75.0000 mg | ORAL_CAPSULE | Freq: Two times a day (BID) | ORAL | 0 refills | Status: DC

## 2017-03-03 NOTE — Patient Instructions (Signed)
It was good to see you today- we will be in touch with your blood test results You may have the flu- I am going to send in an rx for tamiflu, this is an antiviral medication that you take twice a day for 5 days You can also use OTC medications as needed Please let me know if you are not getting better in the next couple of days, sooner if you are worse!

## 2017-03-03 NOTE — Progress Notes (Addendum)
[facility] [address], [address] [place], [place] [phone]  Date:  03/03/2017   Name:  Victoria GanserBrianna Sanchez   DOB:  10/09/1985   MRN:  [id number]  PCP:  Pearline Cablesopland, Mahrosh Donnell C, MD    Chief Complaint: No chief complaint on file.   History of Present Illness:  Victoria GanserBrianna Sanchez is a 31 y.o. very pleasant female patient who presents with the following:  Here today with illness She noted sudden onset of symptoms last night- chills, woke up in a cold sweat during the night No ST Her ears feel congested No sneezing or coughing No GI symptoms No fever when she has checked at home No rash except for some eczema on her belly- her skin has been quite dry, she took an oatmeal bath and has applied cortisone. All resolved now except for around her belly buton No sick contacts at home She has tried some tylenol over the last day for her symptoms   LMP was approx 2 weeks ago  Patient Active Problem List   Diagnosis Date Noted  . PTSD (post-traumatic stress disorder) 11/17/2015    Past Medical History:  Diagnosis Date  . Allergy    Seasonal    No past surgical history on file.  Social History   Tobacco Use  . Smoking status: Never Smoker  Substance Use Topics  . Alcohol use: Yes    Alcohol/week: 0.0 oz    Comment: social  . Drug use: Yes    Family History  Problem Relation Age of Onset  . Hypertension Mother     Allergies  Allergen Reactions  . Penicillins     Medication list has been reviewed and updated.  Current Outpatient Medications on File Prior to Visit  Medication Sig Dispense Refill  . methocarbamol (ROBAXIN) 500 MG tablet Take 1 tablet (500 mg total) by mouth 3 (three) times daily. Use as needed for muscle spasm 30 tablet 0   No current facility-administered medications on file prior to visit.     Review of Systems:  As per HPI- otherwise negative.   Physical Examination: Vitals:   03/03/17 1527  BP: 104/70  Pulse: 84  Temp: 98 F (36.7 C)  SpO2: 99%   Vitals:   03/03/17 1527  Weight: 180 lb 9.6 oz (81.9 kg)  Height: 5\' 5"  (1.651 m)   Body mass index is 30.05 kg/m. Ideal Body Weight: Weight in (lb) to have BMI = 25: 149.9  GEN: WDWN, NAD, Non-toxic, A & O x 3, looks well HEENT: Atraumatic, Normocephalic. Neck supple. No masses, No LAD.  Bilateral TM wnl, oropharynx normal.  PEERL,EOMI.   Ears and Nose: No external deformity.  CV: RRR, No M/G/R. No JVD. No thrill. No extra heart sounds. PULM: CTA B, no wheezes, crackles, rhonchi. No retractions. No resp. distress. No accessory muscle use. ABD: S, NT, ND, +BS. No rebound. No HSM. She has mild skin irritation just around her umbilicus- ? Nickel sensitivity vs eczema EXTR: No c/c/e NEURO Normal gait.  PSYCH: Normally interactive. Conversant. Not depressed or anxious appearing.  Calm demeanor.  No other concerning rash  BP Readings from Last 3 Encounters:  03/03/17 104/70  10/04/16 133/87  09/30/16 118/77     Assessment and Plan: Chills - Plan: oseltamivir (TAMIFLU) 75 MG capsule, CBC, POCT Influenza A/B  Eczema, unspecified type - Plan: triamcinolone cream (KENALOG) 0.1 %  Malaise - Plan: CBC  Here today with non -  specific illness Non toxic No meningismus Rapid flu negative but flu still possible Start on tamiflu, continue OTC meds as needed Get CBC today Will plan further follow- up pending labs. She is to seek care if worse   Signed Abbe AmsterdamJessica Konya Fauble, MD  Received her labs 12/7- message to pt Results for orders placed or performed in visit on 03/03/17  CBC  Result Value Ref Range   WBC 5.7 4.0 - 10.5 K/uL   RBC 4.29 3.87 - 5.11 Mil/uL   Platelets 321.0 150.0 - 400.0 K/uL   Hemoglobin 13.6 12.0 - 15.0 g/dL   HCT 16.140.3 09.636.0 - 04.546.0 %   MCV 94.0 78.0 - 100.0 fl   MCHC 33.7 30.0 - 36.0 g/dL   RDW 40.913.1 81.111.5 - 91.415.5 %  POCT Influenza A/B  Result Value Ref Range   Influenza A, POC Negative  Negative   Influenza B, POC Negative Negative   Message to pt

## 2017-03-04 ENCOUNTER — Encounter: Payer: Self-pay | Admitting: Family Medicine

## 2017-03-04 LAB — CBC
HCT: 40.3 % (ref 36.0–46.0)
Hemoglobin: 13.6 g/dL (ref 12.0–15.0)
MCHC: 33.7 g/dL (ref 30.0–36.0)
MCV: 94 fl (ref 78.0–100.0)
Platelets: 321 10*3/uL (ref 150.0–400.0)
RBC: 4.29 Mil/uL (ref 3.87–5.11)
RDW: 13.1 % (ref 11.5–15.5)
WBC: 5.7 10*3/uL (ref 4.0–10.5)

## 2018-04-06 DIAGNOSIS — R300 Dysuria: Secondary | ICD-10-CM | POA: Diagnosis not present

## 2018-09-16 IMAGING — DX DG SHOULDER 2+V*L*
3 series · 3 of 3 positions shown · non-contrast
Comparison: None.

CLINICAL DATA: Pain

EXAM:
LEFT SHOULDER - 2+ VIEW

[shoulder grashey]
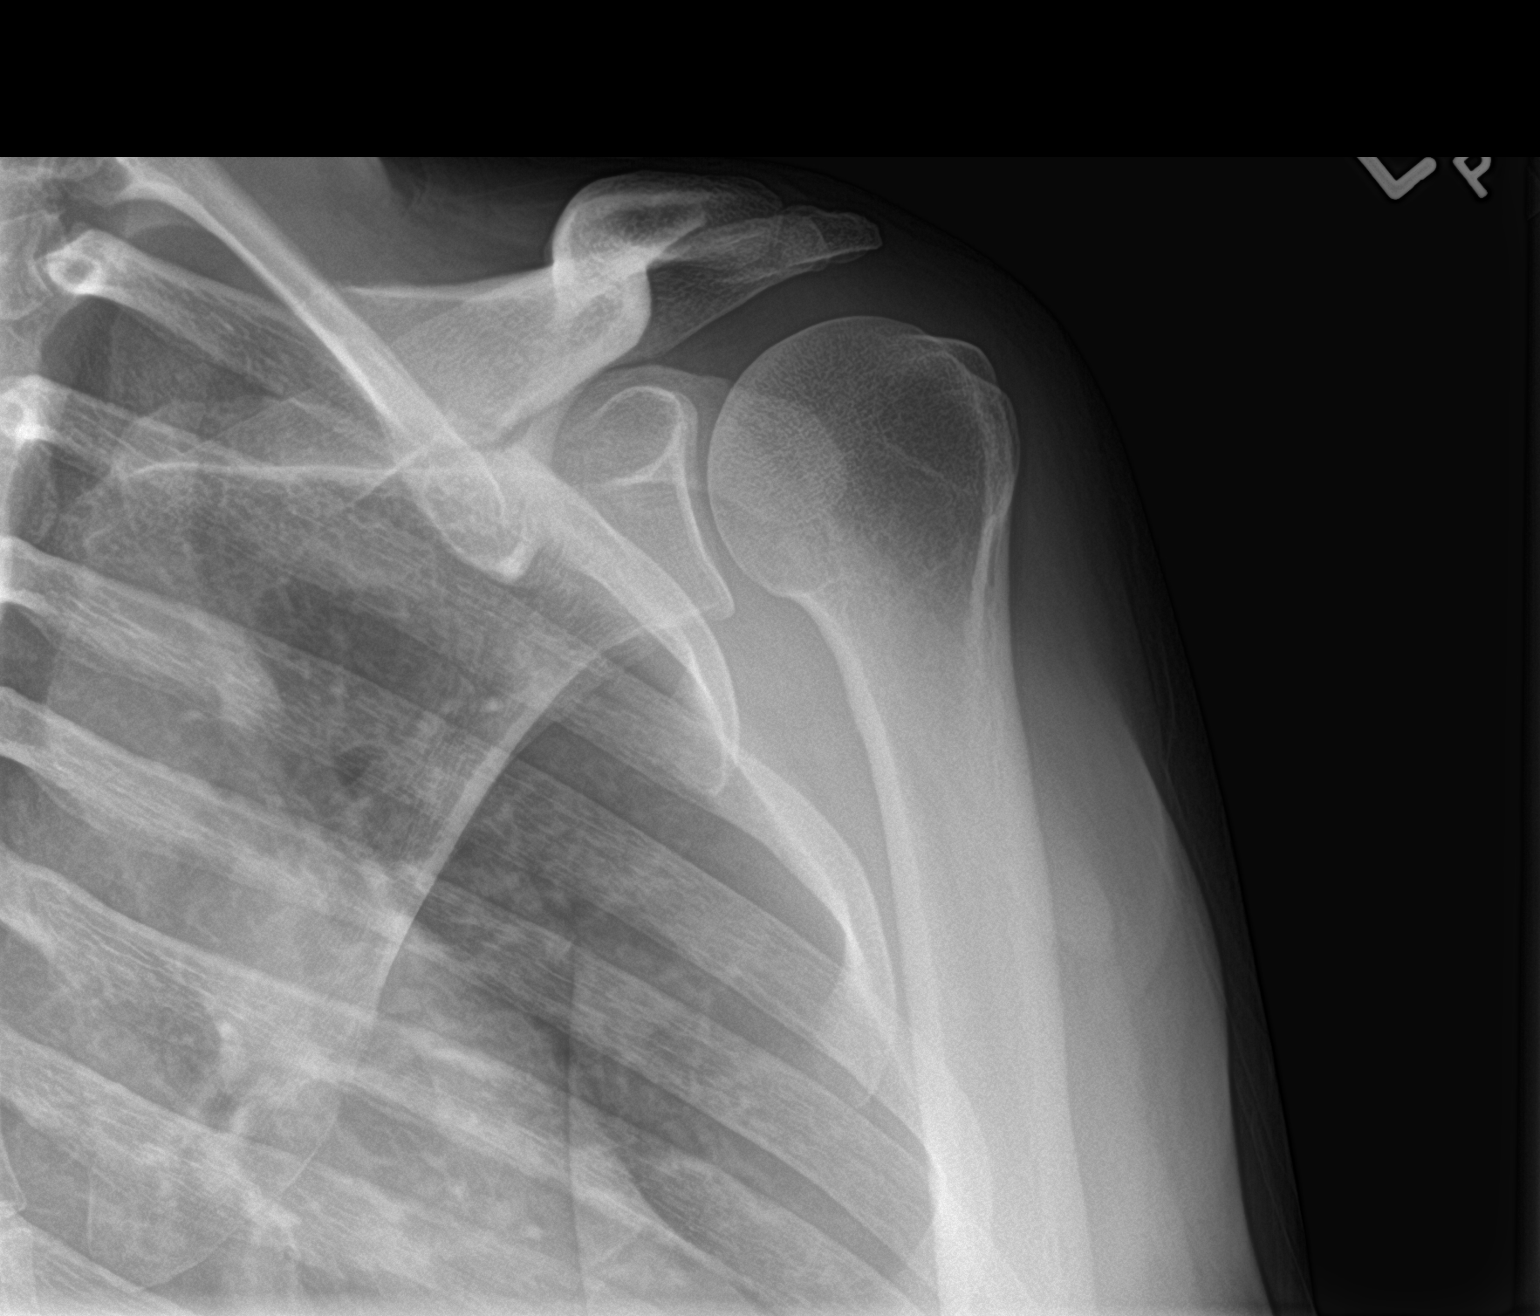

[shoulder y view]
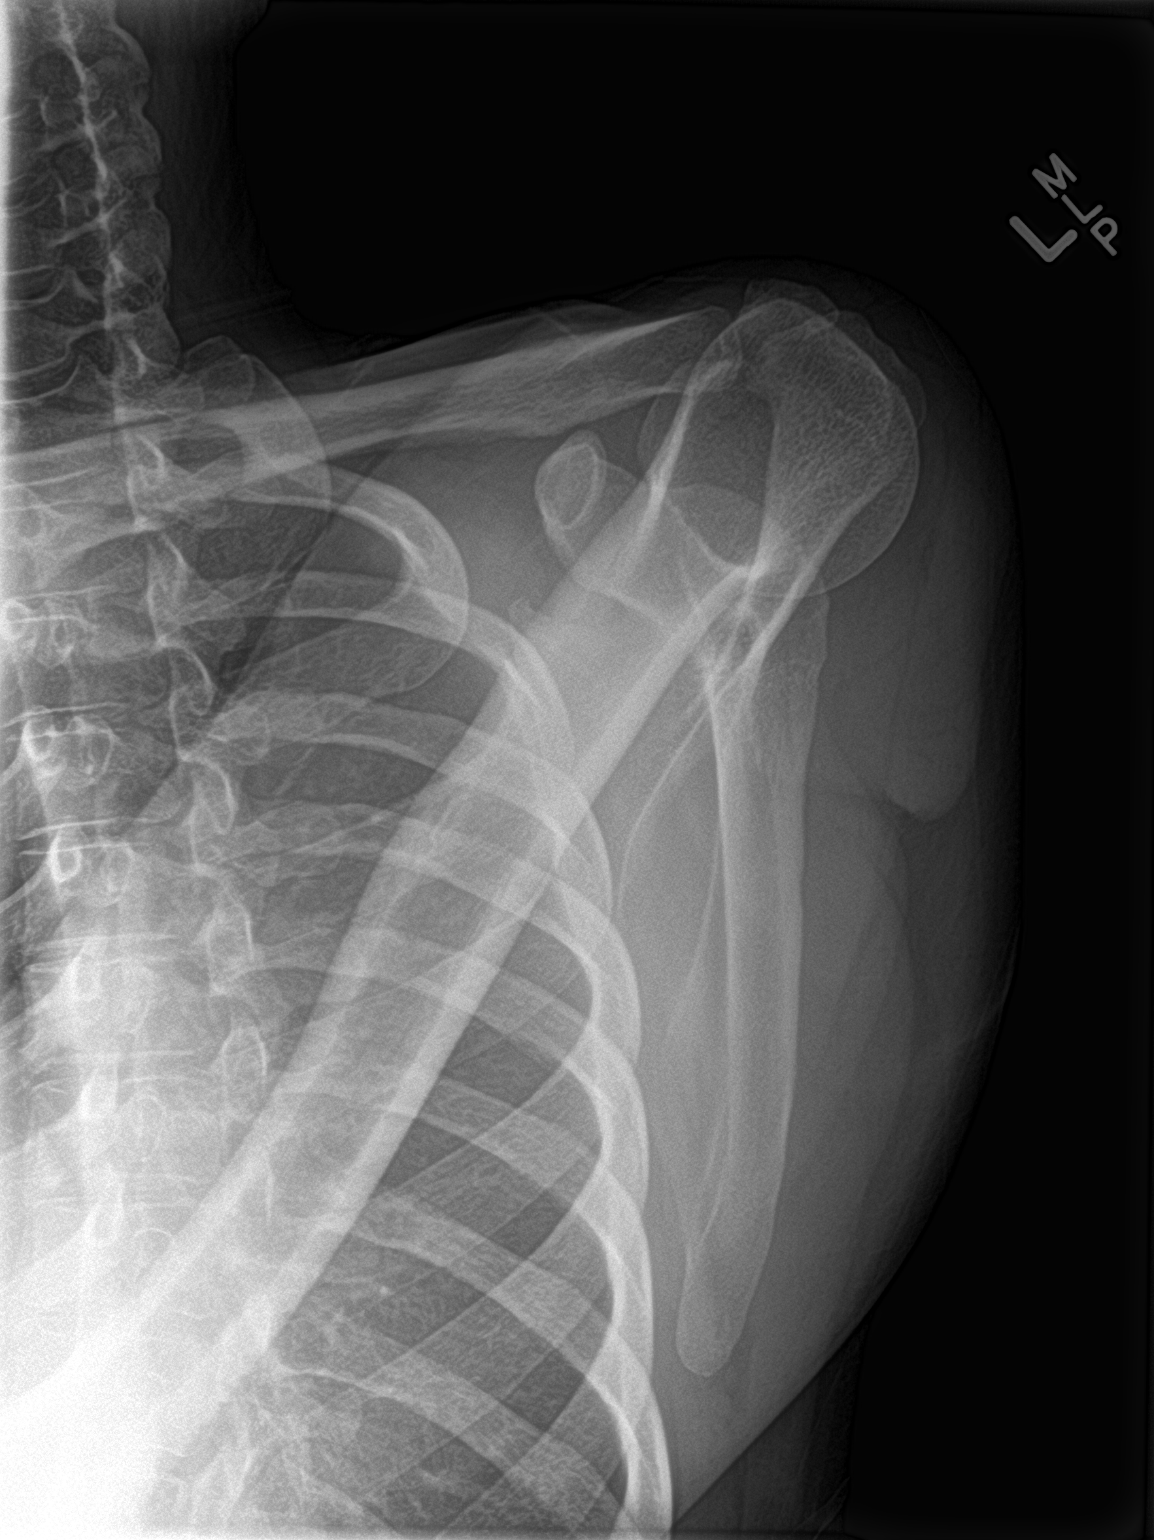

[shoulder axillary]
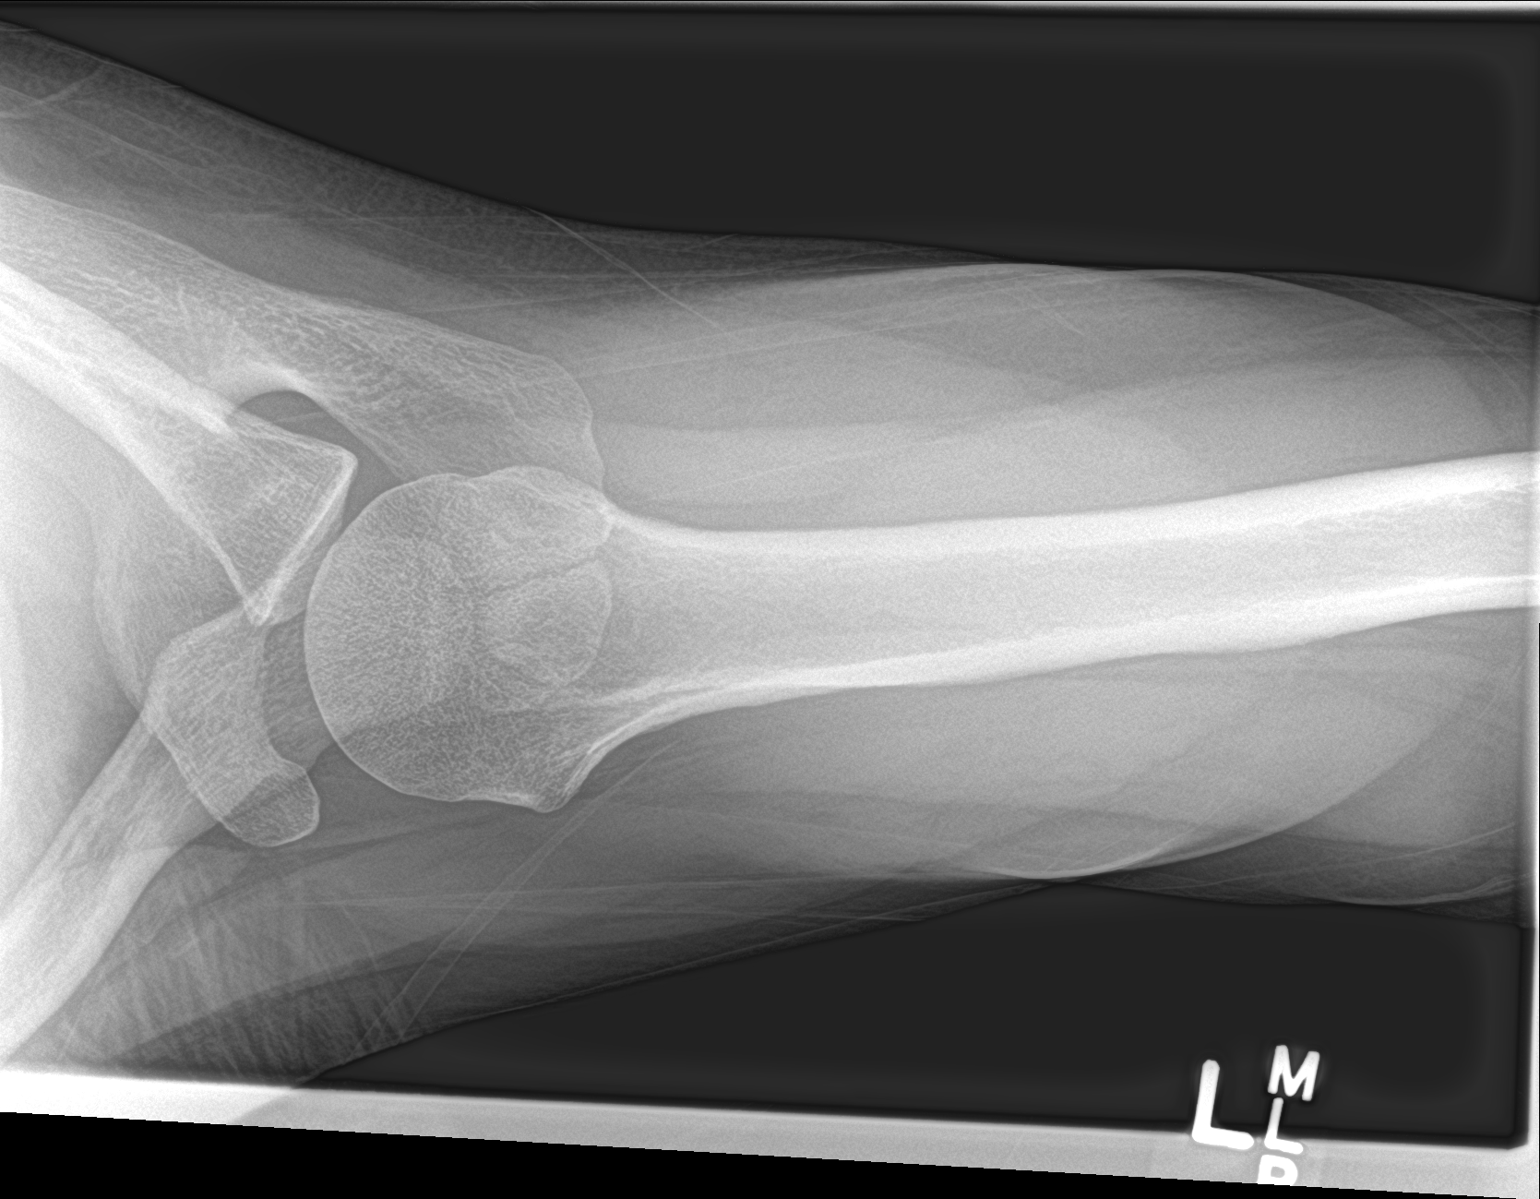

[3 of 3 positions shown; findings below may reference images not displayed]

FINDINGS: Oblique, Y scapular, and axillary images were obtained. There is no
fracture or dislocation. Joint spaces appear normal. No erosive
change or intra-articular calcification. Visualized left lung clear.
IMPRESSION: No fracture or dislocation.  No evident arthropathy.

## 2020-07-21 ENCOUNTER — Other Ambulatory Visit: Payer: Self-pay | Admitting: Family Medicine

## 2020-07-21 DIAGNOSIS — L309 Dermatitis, unspecified: Secondary | ICD-10-CM

## 2020-07-22 ENCOUNTER — Telehealth: Payer: Self-pay | Admitting: Family Medicine

## 2020-07-22 NOTE — Telephone Encounter (Signed)
Ok, will see her

## 2020-07-22 NOTE — Telephone Encounter (Signed)
Patient would like to re-establish care with you.  Please advise

## 2020-07-24 ENCOUNTER — Encounter: Payer: Self-pay | Admitting: Family Medicine

## 2020-07-24 DIAGNOSIS — L309 Dermatitis, unspecified: Secondary | ICD-10-CM

## 2020-07-24 MED ORDER — TRIAMCINOLONE ACETONIDE 0.1 % EX CREA: 1.0000 "application " | TOPICAL_CREAM | Freq: Two times a day (BID) | CUTANEOUS | 1 refills | Status: DC

## 2020-12-17 ENCOUNTER — Encounter: Payer: Self-pay | Admitting: Family Medicine

## 2021-08-20 ENCOUNTER — Telehealth: Payer: Self-pay | Admitting: Family Medicine

## 2021-08-20 NOTE — Telephone Encounter (Signed)
Pt called asking if it would be possible to re-establish care for her with Dr. Patsy Lager. Advised pt that Dr. Patsy Lager is not currently accepting new pts at this time and a message would have to be sent to request approval. Pt acknowledged understanding and wants call back with the results.  Is pt care re-establishment ok for this pt?

## 2021-09-03 NOTE — Patient Instructions (Incomplete)
It was nice to see you again today! I will be in touch with your labs asap We will order a thyroid ultrasound for you - please stop by the imaging dept on the ground floor and schedule your thyroid ultrasound asap  Tetanus booster given today
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
## 2021-09-07 ENCOUNTER — Ambulatory Visit (INDEPENDENT_AMBULATORY_CARE_PROVIDER_SITE_OTHER): Payer: 59 | Admitting: Family Medicine

## 2021-09-07 ENCOUNTER — Ambulatory Visit (HOSPITAL_BASED_OUTPATIENT_CLINIC_OR_DEPARTMENT_OTHER)
Admission: RE | Admit: 2021-09-07 | Discharge: 2021-09-07 | Disposition: A | Discharge status: Home / Self Care | Payer: 59 | Source: Ambulatory Visit | Attending: Family Medicine | Admitting: Family Medicine

## 2021-09-07 ENCOUNTER — Other Ambulatory Visit (HOSPITAL_COMMUNITY)
Admission: RE | Admit: 2021-09-07 | Discharge: 2021-09-07 | Disposition: A | Discharge status: Home / Self Care | Payer: 59 | Source: Ambulatory Visit | Attending: Family Medicine | Admitting: Family Medicine

## 2021-09-07 ENCOUNTER — Encounter (HOSPITAL_BASED_OUTPATIENT_CLINIC_OR_DEPARTMENT_OTHER): Payer: Self-pay

## 2021-09-07 ENCOUNTER — Encounter (INDEPENDENT_AMBULATORY_CARE_PROVIDER_SITE_OTHER): Payer: 59 | Admitting: Family Medicine

## 2021-09-07 VITALS — BP 110/72 | HR 76 | Temp 97.8°F | Resp 18 | Ht 65.0 in | Wt 195.2 lb

## 2021-09-07 DIAGNOSIS — Z124 Encounter for screening for malignant neoplasm of cervix: Secondary | ICD-10-CM

## 2021-09-07 DIAGNOSIS — Z23 Encounter for immunization: Secondary | ICD-10-CM | POA: Diagnosis not present

## 2021-09-07 DIAGNOSIS — E049 Nontoxic goiter, unspecified: Secondary | ICD-10-CM

## 2021-09-07 DIAGNOSIS — R300 Dysuria: Secondary | ICD-10-CM

## 2021-09-07 DIAGNOSIS — Z13220 Encounter for screening for lipoid disorders: Secondary | ICD-10-CM | POA: Diagnosis not present

## 2021-09-07 DIAGNOSIS — Z131 Encounter for screening for diabetes mellitus: Secondary | ICD-10-CM

## 2021-09-07 DIAGNOSIS — Z113 Encounter for screening for infections with a predominantly sexual mode of transmission: Secondary | ICD-10-CM | POA: Diagnosis not present

## 2021-09-07 DIAGNOSIS — Z0000 Encounter for general adult medical examination without abnormal findings: Secondary | ICD-10-CM | POA: Diagnosis not present

## 2021-09-07 DIAGNOSIS — Z130 Encounter for screening for diseases of the blood and blood-forming organs and certain disorders involving the immune mechanism: Secondary | ICD-10-CM

## 2021-09-07 LAB — COMPREHENSIVE METABOLIC PANEL
ALT: 14 U/L (ref 0–35)
AST: 17 U/L (ref 0–37)
Albumin: 4.4 g/dL (ref 3.5–5.2)
Alkaline Phosphatase: 69 U/L (ref 39–117)
BUN: 9 mg/dL (ref 6–23)
CO2: 23 mEq/L (ref 19–32)
Calcium: 9.3 mg/dL (ref 8.4–10.5)
Chloride: 105 mEq/L (ref 96–112)
Creatinine, Ser: 0.74 mg/dL (ref 0.40–1.20)
GFR: 103.96 mL/min (ref >60.00)
Glucose, Bld: 76 mg/dL (ref 70–99)
Potassium: 3.8 mEq/L (ref 3.5–5.1)
Sodium: 137 mEq/L (ref 135–145)
Total Bilirubin: 0.8 mg/dL (ref 0.2–1.2)
Total Protein: 7.1 g/dL (ref 6.0–8.3)

## 2021-09-07 LAB — LIPID PANEL
Cholesterol: 173 mg/dL (ref 0–200)
HDL: 106.5 mg/dL (ref >39.00)
LDL Cholesterol: 54 mg/dL (ref 0–99)
NonHDL: 66.76
Total CHOL/HDL Ratio: 2
Triglycerides: 63 mg/dL (ref 0.0–149.0)
VLDL: 12.6 mg/dL (ref 0.0–40.0)

## 2021-09-07 LAB — CBC
HCT: 36.5 % (ref 36.0–46.0)
Hemoglobin: 12.4 g/dL (ref 12.0–15.0)
MCHC: 34.1 g/dL (ref 30.0–36.0)
MCV: 95 fl (ref 78.0–100.0)
Platelets: 292 10*3/uL (ref 150.0–400.0)
RBC: 3.84 Mil/uL — ABNORMAL LOW (ref 3.87–5.11)
RDW: 13.5 % (ref 11.5–15.5)
WBC: 4.4 10*3/uL (ref 4.0–10.5)

## 2021-09-07 LAB — TSH: TSH: 1.27 u[IU]/mL (ref 0.35–5.50)

## 2021-09-07 LAB — HEMOGLOBIN A1C: Hgb A1c MFr Bld: 5 % (ref 4.6–6.5)

## 2021-09-08 ENCOUNTER — Encounter: Payer: Self-pay | Admitting: Family Medicine

## 2021-09-08 LAB — HEPATITIS B SURFACE ANTIBODY, QUANTITATIVE: Hepatitis B-Post: <5 m[IU]/mL — ABNORMAL LOW (ref >=10)

## 2021-09-08 LAB — RPR: RPR Ser Ql: NONREACTIVE

## 2021-09-08 LAB — CYTOLOGY - PAP
Chlamydia: NEGATIVE
Comment: NEGATIVE
Comment: NEGATIVE
Comment: NEGATIVE
Comment: NORMAL
Diagnosis: NEGATIVE
High risk HPV: NEGATIVE
Neisseria Gonorrhea: NEGATIVE
Trichomonas: NEGATIVE

## 2021-09-08 LAB — HIV ANTIBODY (ROUTINE TESTING W REFLEX): HIV 1&2 Ab, 4th Generation: NONREACTIVE

## 2021-09-08 LAB — HEPATITIS B SURFACE ANTIGEN: Hepatitis B Surface Ag: NONREACTIVE

## 2021-09-08 LAB — HEPATITIS C ANTIBODY
Hepatitis C Ab: NONREACTIVE
SIGNAL TO CUT-OFF: 0.18 (ref <1.00)

## 2021-09-09 ENCOUNTER — Encounter: Payer: Self-pay | Admitting: Family Medicine

## 2021-10-06 ENCOUNTER — Other Ambulatory Visit: Payer: 59

## 2021-10-06 DIAGNOSIS — R300 Dysuria: Secondary | ICD-10-CM

## 2021-10-06 MED ORDER — NITROFURANTOIN MONOHYD MACRO 100 MG PO CAPS: 100.0000 mg | ORAL_CAPSULE | Freq: Two times a day (BID) | ORAL | 0 refills | Status: AC

## 2021-10-06 NOTE — Addendum Note (Signed)
Addended by: Abbe Amsterdam C on: 10/06/2021 05:42 PM   Modules accepted: Orders

## 2021-10-06 NOTE — Telephone Encounter (Signed)
Please see the MyChart message reply(ies) for my assessment and plan.  The patient gave consent for this Medical Advice Message and is aware that it may result in a bill to their insurance company as well as the possibility that this may result in a co-payment or deductible. They are an established patient, but are not seeking medical advice exclusively about a problem treated during an in person or video visit in the last 7 days. I did not recommend an in person or video visit within 7 days of my reply.  I spent a total of7 minutes cumulative time within 7 days through MyChart messaging Tejasvi Brissett, MD  

## 2021-10-08 ENCOUNTER — Encounter: Payer: Self-pay | Admitting: Family Medicine

## 2021-10-09 ENCOUNTER — Encounter: Payer: Self-pay | Admitting: Family Medicine

## 2021-10-09 LAB — URINE CULTURE
MICRO NUMBER:: 13631264
SPECIMEN QUALITY:: ADEQUATE

## 2021-11-12 ENCOUNTER — Encounter: Payer: Self-pay | Admitting: Family Medicine

## 2021-11-12 ENCOUNTER — Other Ambulatory Visit: Payer: Self-pay

## 2021-11-12 DIAGNOSIS — L309 Dermatitis, unspecified: Secondary | ICD-10-CM

## 2021-11-12 MED ORDER — TRIAMCINOLONE ACETONIDE 0.1 % EX CREA: 1.0000 | TOPICAL_CREAM | Freq: Two times a day (BID) | CUTANEOUS | 1 refills | Status: AC

## 2022-05-08 ENCOUNTER — Encounter: Payer: Self-pay | Admitting: Family Medicine

## 2023-12-17 ENCOUNTER — Ambulatory Visit
Admission: RE | Admit: 2023-12-17 | Discharge: 2023-12-17 | Disposition: A | Discharge status: Home / Self Care | Attending: Physician Assistant | Admitting: Physician Assistant

## 2023-12-17 ENCOUNTER — Other Ambulatory Visit: Payer: Self-pay

## 2023-12-17 ENCOUNTER — Ambulatory Visit (INDEPENDENT_AMBULATORY_CARE_PROVIDER_SITE_OTHER): Admitting: Radiology

## 2023-12-17 VITALS — BP 127/84 | HR 72 | Temp 98.1°F | Resp 16 | Ht 65.0 in | Wt 210.0 lb

## 2023-12-17 DIAGNOSIS — S59911A Unspecified injury of right forearm, initial encounter: Secondary | ICD-10-CM | POA: Diagnosis not present
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
## 2023-12-19 ENCOUNTER — Ambulatory Visit (HOSPITAL_COMMUNITY): Payer: Self-pay

## 2024-01-03 NOTE — Patient Instructions (Incomplete)
 It was great to see you today, I will be in touch with your labs
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
## 2024-01-04 ENCOUNTER — Encounter: Payer: Self-pay | Admitting: Family Medicine

## 2024-01-04 ENCOUNTER — Ambulatory Visit (INDEPENDENT_AMBULATORY_CARE_PROVIDER_SITE_OTHER): Admitting: Family Medicine

## 2024-01-04 VITALS — BP 118/82 | HR 67 | Temp 98.1°F | Ht 65.0 in | Wt 215.8 lb

## 2024-01-04 DIAGNOSIS — Z13220 Encounter for screening for lipoid disorders: Secondary | ICD-10-CM | POA: Diagnosis not present

## 2024-01-04 DIAGNOSIS — Z0000 Encounter for general adult medical examination without abnormal findings: Secondary | ICD-10-CM | POA: Diagnosis not present

## 2024-01-04 DIAGNOSIS — Z131 Encounter for screening for diabetes mellitus: Secondary | ICD-10-CM | POA: Diagnosis not present

## 2024-01-04 DIAGNOSIS — Z130 Encounter for screening for diseases of the blood and blood-forming organs and certain disorders involving the immune mechanism: Secondary | ICD-10-CM

## 2024-01-04 DIAGNOSIS — Z800 Family history of malignant neoplasm of digestive organs: Secondary | ICD-10-CM

## 2024-01-04 DIAGNOSIS — Z1329 Encounter for screening for other suspected endocrine disorder: Secondary | ICD-10-CM

## 2024-01-05 ENCOUNTER — Encounter: Payer: Self-pay | Admitting: Family Medicine

## 2024-01-05 ENCOUNTER — Ambulatory Visit: Payer: Self-pay | Admitting: Family Medicine

## 2024-01-05 LAB — COMPREHENSIVE METABOLIC PANEL WITH GFR
ALT: 22 U/L (ref 0–35)
AST: 21 U/L (ref 0–37)
Albumin: 4.4 g/dL (ref 3.5–5.2)
Alkaline Phosphatase: 61 U/L (ref 39–117)
BUN: 9 mg/dL (ref 6–23)
CO2: 24 meq/L (ref 19–32)
Calcium: 8.9 mg/dL (ref 8.4–10.5)
Chloride: 106 meq/L (ref 96–112)
Creatinine, Ser: 0.75 mg/dL (ref 0.40–1.20)
GFR: 100.65 mL/min (ref >60.00)
Glucose, Bld: 83 mg/dL (ref 70–99)
Potassium: 4.1 meq/L (ref 3.5–5.1)
Sodium: 137 meq/L (ref 135–145)
Total Bilirubin: 0.6 mg/dL (ref 0.2–1.2)
Total Protein: 7.1 g/dL (ref 6.0–8.3)

## 2024-01-05 LAB — CBC
HCT: 38.8 % (ref 36.0–46.0)
Hemoglobin: 12.8 g/dL (ref 12.0–15.0)
MCHC: 32.9 g/dL (ref 30.0–36.0)
MCV: 95.5 fl (ref 78.0–100.0)
Platelets: 299 K/uL (ref 150.0–400.0)
RBC: 4.06 Mil/uL (ref 3.87–5.11)
RDW: 13.4 % (ref 11.5–15.5)
WBC: 5 K/uL (ref 4.0–10.5)

## 2024-01-05 LAB — LIPID PANEL
Cholesterol: 164 mg/dL (ref 0–200)
HDL: 84.4 mg/dL (ref >39.00)
LDL Cholesterol: 63 mg/dL (ref 0–99)
NonHDL: 79.67
Total CHOL/HDL Ratio: 2
Triglycerides: 81 mg/dL (ref 0.0–149.0)
VLDL: 16.2 mg/dL (ref 0.0–40.0)

## 2024-01-05 LAB — HEMOGLOBIN A1C: Hgb A1c MFr Bld: 5.4 % (ref 4.6–6.5)

## 2024-01-05 LAB — TSH: TSH: 0.93 u[IU]/mL (ref 0.35–5.50)

## 2024-03-06 ENCOUNTER — Encounter: Payer: Self-pay | Admitting: Family Medicine

## 2024-03-08 MED ORDER — ZEPBOUND 2.5 MG/0.5ML ~~LOC~~ SOAJ: 2.5000 mg | SUBCUTANEOUS | 2 refills | Status: DC

## 2024-03-08 NOTE — Addendum Note (Signed)
 Addended by: WATT RAISIN C on: 03/08/2024 08:56 PM   Modules accepted: Orders

## 2024-03-12 ENCOUNTER — Telehealth: Payer: Self-pay

## 2024-03-12 ENCOUNTER — Other Ambulatory Visit (HOSPITAL_COMMUNITY): Payer: Self-pay

## 2024-03-12 NOTE — Telephone Encounter (Unsigned)
 Copied from CRM #[id number]. Topic: Clinical - Medication Prior Auth >> Mar 12, 2024  1:11 PM Deleta RAMAN wrote: Reason for CRM: patient is calling due to prescription authorization being needed for weight loss medication. Patient would like an update regarding concerns please follow up at the number on file.

## 2024-03-12 NOTE — Telephone Encounter (Signed)
 Pharmacy Patient Advocate Encounter   Received notification from Patient Advice Request messages that prior authorization for Zepbound  2.5mg /0.23ml is required/requested.   Insurance verification completed.   The patient is insured through [facility].   Per test claim: Per test claim, medication is not covered due to plan/benefit exclusion, PA not submitted at this time  Zepbound  is a plan/benefit exclusion.

## 2024-03-14 MED ORDER — TIRZEPATIDE-WEIGHT MANAGEMENT 2.5 MG/0.5ML ~~LOC~~ SOLN: 2.5000 mg | SUBCUTANEOUS | 2 refills | Status: AC

## 2024-03-14 NOTE — Addendum Note (Signed)
 Addended by: WATT RAISIN C on: 03/14/2024 08:32 PM   Modules accepted: Orders
